# Patient Record
Sex: Male | Born: 2000 | Race: White | Marital: Single | Smoking: Never smoker
Health system: Southern US, Community
[De-identification: ages and names within clinical notes are randomized; demographics above are authoritative.]

---
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
## 2018-03-04 IMAGING — DX DG ABDOMEN 1V
1 series · 1 of 1 positions shown · non-contrast
Comparison: None.

CLINICAL DATA: Right lower quadrant abdominal pain for 1 month.

EXAM:
ABDOMEN - 1 VIEW

[dg abd 1 view]
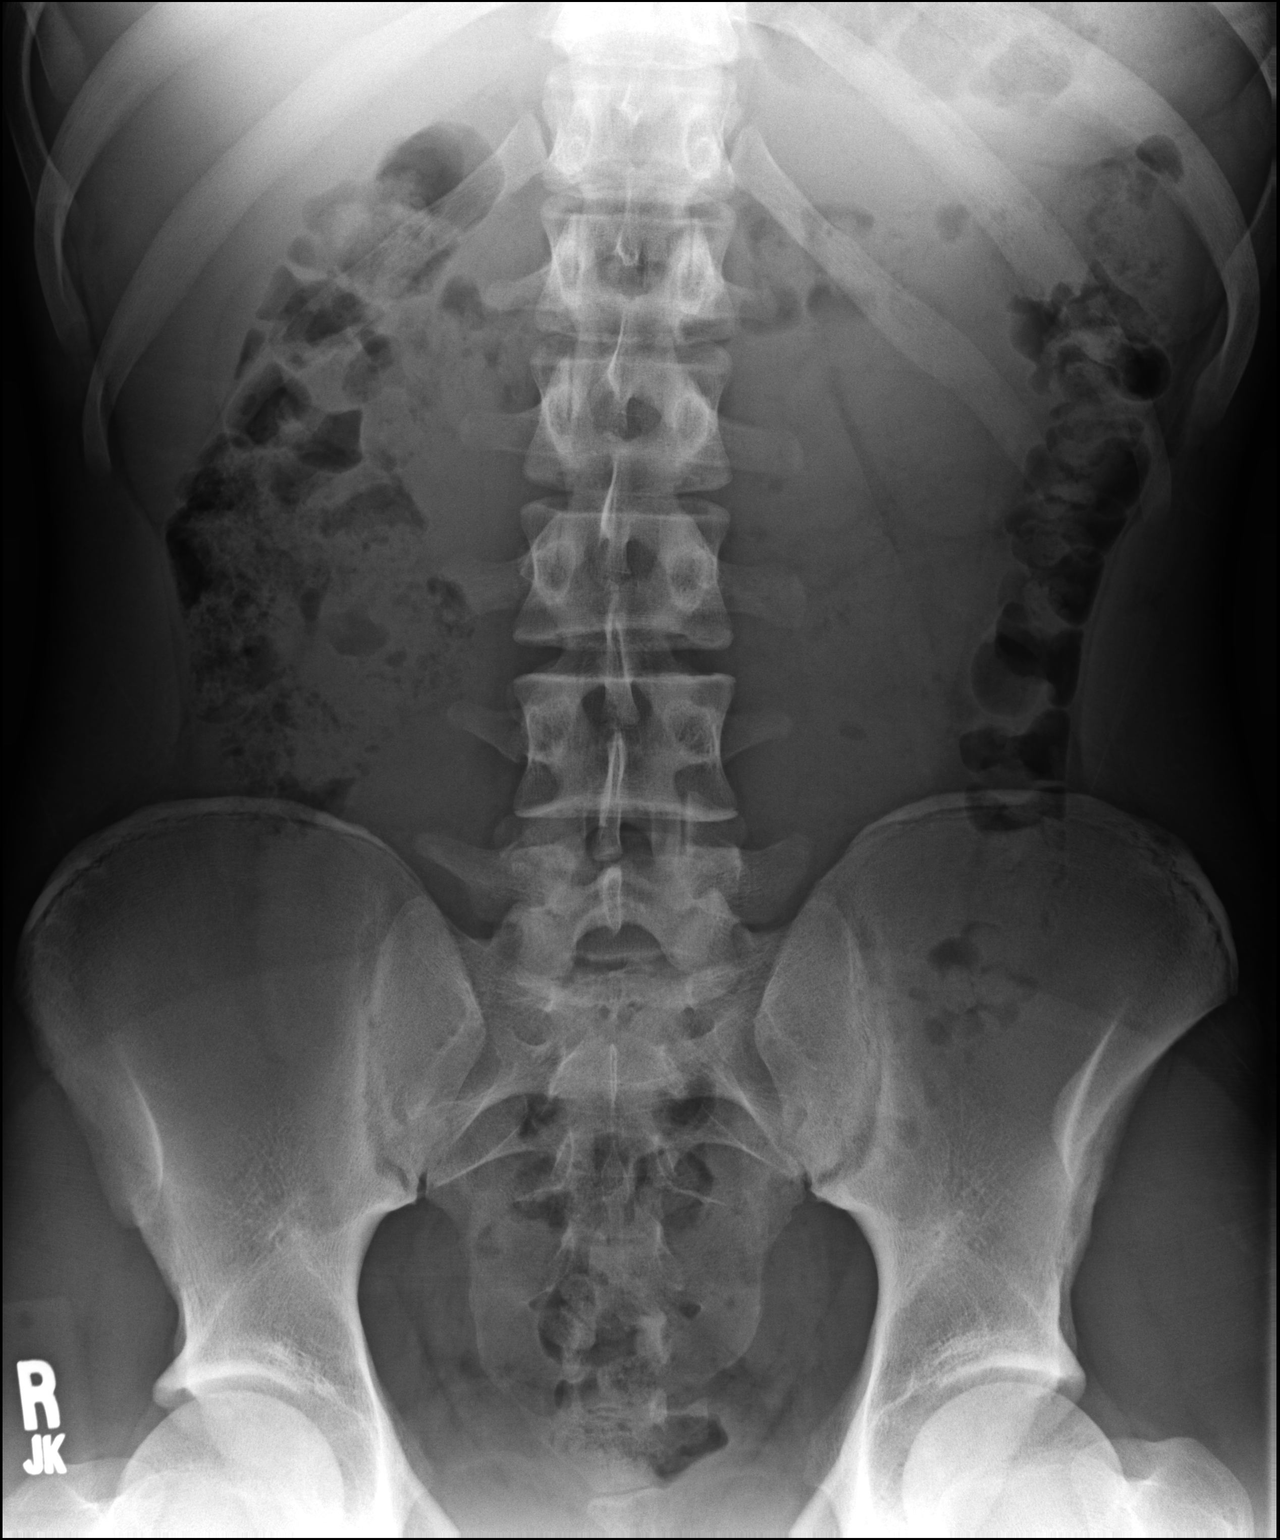

[1 of 1 positions shown; findings below may reference images not displayed]

FINDINGS: The bowel gas pattern is normal. No radio-opaque calculi or other
significant radiographic abnormality are seen.
IMPRESSION: Negative one view abdomen.

## 2018-04-27 ENCOUNTER — Encounter: Payer: Self-pay | Admitting: Family Medicine

## 2018-04-27 ENCOUNTER — Ambulatory Visit (INDEPENDENT_AMBULATORY_CARE_PROVIDER_SITE_OTHER): Payer: 59

## 2018-04-27 ENCOUNTER — Other Ambulatory Visit: Payer: Self-pay

## 2018-04-27 ENCOUNTER — Ambulatory Visit (INDEPENDENT_AMBULATORY_CARE_PROVIDER_SITE_OTHER): Payer: 59 | Admitting: Family Medicine

## 2018-04-27 ENCOUNTER — Other Ambulatory Visit: Payer: Self-pay | Admitting: Family Medicine

## 2018-04-27 VITALS — BP 122/70 | HR 98 | Temp 98.4°F | Ht 79.0 in | Wt 240.0 lb

## 2018-04-27 DIAGNOSIS — D72820 Lymphocytosis (symptomatic): Secondary | ICD-10-CM | POA: Diagnosis not present

## 2018-04-27 DIAGNOSIS — R042 Hemoptysis: Secondary | ICD-10-CM

## 2018-04-27 DIAGNOSIS — J351 Hypertrophy of tonsils: Secondary | ICD-10-CM | POA: Diagnosis not present

## 2018-04-27 DIAGNOSIS — Z114 Encounter for screening for human immunodeficiency virus [HIV]: Secondary | ICD-10-CM | POA: Diagnosis not present

## 2018-04-27 LAB — COMPREHENSIVE METABOLIC PANEL
ALT: 139 U/L — ABNORMAL HIGH (ref 0–53)
AST: 114 U/L — ABNORMAL HIGH (ref 0–37)
Albumin: 4.4 g/dL (ref 3.5–5.2)
Alkaline Phosphatase: 127 U/L (ref 52–171)
BUN: 9 mg/dL (ref 6–23)
CO2: 26 mEq/L (ref 19–32)
Calcium: 9.6 mg/dL (ref 8.4–10.5)
Chloride: 102 mEq/L (ref 96–112)
Creatinine, Ser: 1.23 mg/dL (ref 0.40–1.50)
GFR: 75.03 mL/min (ref >60.00)
Glucose, Bld: 89 mg/dL (ref 70–99)
Potassium: 4.2 mEq/L (ref 3.5–5.1)
Sodium: 137 mEq/L (ref 135–145)
Total Bilirubin: 1.4 mg/dL — ABNORMAL HIGH (ref 0.2–1.2)
Total Protein: 7.4 g/dL (ref 6.0–8.3)

## 2018-04-27 LAB — CBC WITH DIFFERENTIAL/PLATELET
Basophils Absolute: 0.1 10*3/uL (ref 0.0–0.1)
Basophils Relative: 0.6 % (ref 0.0–3.0)
Eosinophils Absolute: 0.1 10*3/uL (ref 0.0–0.7)
Eosinophils Relative: 0.8 % (ref 0.0–5.0)
HCT: 42.8 % (ref 36.0–49.0)
Hemoglobin: 14.7 g/dL (ref 12.0–16.0)
Lymphocytes Relative: 68.5 % — ABNORMAL HIGH (ref 24.0–48.0)
Lymphs Abs: 6.4 10*3/uL — ABNORMAL HIGH (ref 0.7–4.0)
MCHC: 34.3 g/dL (ref 31.0–37.0)
MCV: 82.8 fl (ref 78.0–98.0)
Monocytes Absolute: 0.9 10*3/uL (ref 0.1–1.0)
Monocytes Relative: 10 % (ref 3.0–12.0)
Neutro Abs: 1.9 10*3/uL (ref 1.4–7.7)
Neutrophils Relative %: 20.1 % — ABNORMAL LOW (ref 43.0–71.0)
Platelets: 155 10*3/uL (ref 150.0–575.0)
RBC: 5.17 Mil/uL (ref 3.80–5.70)
RDW: 12.9 % (ref 11.4–15.5)
WBC: 9.3 10*3/uL (ref 4.5–13.5)

## 2018-04-27 LAB — POCT RAPID STREP A (OFFICE): Rapid Strep A Screen: NEGATIVE

## 2018-04-27 LAB — PROTIME-INR
INR: 1.2 ratio — ABNORMAL HIGH (ref 0.8–1.0)
Prothrombin Time: 14.2 s — ABNORMAL HIGH (ref 9.6–13.1)

## 2018-04-27 LAB — TSH: TSH: 1.93 u[IU]/mL (ref 0.40–5.00)

## 2018-04-27 NOTE — Patient Instructions (Signed)
I think it's coming from tonsil/posterior pharynx.  Will call do referrral to ENT and call them.  Keep up cool mist humidifier and nasal saline spray.

## 2018-04-27 NOTE — Progress Notes (Signed)
Patient: Robert Fleming MRN: [id number] DOB: Feb 22, 2000 PCP: Orland Mustard, MD     Subjective:  Chief Complaint  Patient presents with  . Hemoptysis    spitting up blood     HPI: The patient is a 18 y.o. male who presents today for spitting up blood x 2 days. He states yesterday it happened in the AM and it happened while he was laying down in his bed. It only happens when he is laying down in his bed. It also only happens in his room, not downstairs when he sleeps on the cough. It is frank, blood. He states it does go away. He has no cough, no shortness of breath and no respiratory symptoms. He denies any alcohol or vomiting prior to this. He feels 100%, and feels like blood is going down his throat into his stomach. He does not take NSAIDs, no greasy foods, no smoking. Hx of vaping in high school and a little in college. None over past 3-6 months. No other drug use.   No nausea/vomiting or dark stool. He has not had a bloody nose, denies easy bruising, stomach pain. His gums do not bleed. No hx of bleeding disorders in the family.   Review of Systems  Constitutional: Negative for chills, fatigue and fever.  HENT: Negative for congestion, dental problem, ear pain, hearing loss, nosebleeds, postnasal drip and trouble swallowing.        Spitting up blood   Eyes: Negative for visual disturbance.  Respiratory: Negative for cough, chest tightness and shortness of breath.   Cardiovascular: Negative for chest pain, palpitations and leg swelling.  Gastrointestinal: Negative for abdominal pain, blood in stool, constipation, diarrhea, nausea and vomiting.  Endocrine: Negative for cold intolerance, polydipsia, polyphagia and polyuria.  Genitourinary: Negative for dysuria and hematuria.  Musculoskeletal: Negative for arthralgias and back pain.  Skin: Negative for rash.  Neurological: Positive for headaches. Negative for dizziness.  Psychiatric/Behavioral: Negative for dysphoric mood and sleep  disturbance. The patient is not nervous/anxious.     Allergies Patient is allergic to penicillin g and penicillins.  Past Medical History Patient  has no past medical history on file.  Surgical History Patient  has no past surgical history on file.  Family History Pateint's family history is not on file.  Social History Patient  reports that he has never smoked. He has never used smokeless tobacco.    Objective: Vitals:   04/27/18 0921  BP: 122/70  Pulse: 98  Temp: 98.4 F (36.9 C)  TempSrc: Oral  SpO2: 98%  Weight: 240 lb (108.9 kg)  Height: 6\' 7"  (2.007 m)    Body mass index is 27.04 kg/m.  Physical Exam Vitals signs reviewed.  Constitutional:      Appearance: Normal appearance.  HENT:     Head: Normocephalic and atraumatic.     Right Ear: Tympanic membrane, ear canal and external ear normal.     Left Ear: Tympanic membrane, ear canal and external ear normal.     Nose: Nose normal. No congestion or rhinorrhea.     Comments: No enlarged blood vessels or bleeding vessels     Mouth/Throat:     Mouth: Mucous membranes are moist.     Comments: He has 4+ tonsils bilaterally with an enlarged/tortuous blood vessel in left tonsil and posterior pharynx. No active bleeding  Eyes:     Extraocular Movements: Extraocular movements intact.     Pupils: Pupils are equal, round, and reactive to light.  Neck:  Musculoskeletal: Normal range of motion and neck supple.  Cardiovascular:     Rate and Rhythm: Normal rate and regular rhythm.     Heart sounds: Normal heart sounds.  Pulmonary:     Effort: Pulmonary effort is normal.     Breath sounds: Normal breath sounds. No stridor.  Lymphadenopathy:     Cervical: Cervical adenopathy present.  Neurological:     Mental Status: He is alert.        Rapid strep: negative  CXR: no acute process. ?airway disease.   Assessment/plan: 1. Spitting up blood He has 2 blood vessels that could be culprit on his tonsil and  posterior pharynx. Labs done and referral to ENT placed. Called ENT and the will see him. Called mom and let her know to go ahead and call Dr. Ezzard Standing and make an appointment referral in place. Continue with cool mist humidifier and nasal saline sprays. Precautions given.   - DG Chest 2 View - POCT rapid strep A - CBC with Differential/Platelet - Comprehensive metabolic panel - Protime-INR - TSH  2. Encounter for screening for HIV  - HIV Antibody (routine testing w rflx)   3. Enlarged tonsils -4+: referral to ent.   Return if symptoms worsen or fail to improve.   Orland Mustard, MD [facility]   04/27/2018

## 2018-04-28 ENCOUNTER — Other Ambulatory Visit: Payer: Self-pay

## 2018-04-28 ENCOUNTER — Other Ambulatory Visit (INDEPENDENT_AMBULATORY_CARE_PROVIDER_SITE_OTHER): Payer: 59

## 2018-04-28 DIAGNOSIS — D72820 Lymphocytosis (symptomatic): Secondary | ICD-10-CM

## 2018-04-28 LAB — WHITE CELL DIFFERENTIAL
Band Neutrophils: 15 %
Basophils Relative: 0 % (ref 0.0–3.0)
Eosinophils Relative: 0 % (ref 0.0–5.0)
Lymphocytes Relative: 75 % — ABNORMAL HIGH (ref 24.0–48.0)
Monocytes Relative: 8 % (ref 3.0–12.0)
Neutrophils Relative %: 2 % — ABNORMAL LOW (ref 43.0–71.0)

## 2018-04-28 NOTE — Addendum Note (Signed)
Addended by: Young Berry T on: 04/28/2018 08:33 AM   Modules accepted: Orders

## 2018-04-28 NOTE — Addendum Note (Signed)
Addended by: Young Berry T on: 04/28/2018 10:32 AM   Modules accepted: Orders

## 2018-04-29 ENCOUNTER — Other Ambulatory Visit: Payer: Self-pay | Admitting: Family Medicine

## 2018-04-29 ENCOUNTER — Other Ambulatory Visit (INDEPENDENT_AMBULATORY_CARE_PROVIDER_SITE_OTHER): Payer: 59

## 2018-04-29 DIAGNOSIS — D72820 Lymphocytosis (symptomatic): Secondary | ICD-10-CM

## 2018-04-29 LAB — TEST AUTHORIZATION

## 2018-04-29 LAB — EPSTEIN-BARR VIRUS VCA ANTIBODY PANEL
EBV NA IgG: <18 U/mL
EBV VCA IgG: 103 U/mL — ABNORMAL HIGH
EBV VCA IgM: >160 U/mL — ABNORMAL HIGH

## 2018-04-29 LAB — HIV ANTIBODY (ROUTINE TESTING W REFLEX): HIV 1&2 Ab, 4th Generation: NONREACTIVE

## 2018-04-29 LAB — MONONUCLEOSIS SCREEN: Mono Screen: POSITIVE — AB

## 2018-04-29 NOTE — Progress Notes (Signed)
mono

## 2018-05-04 ENCOUNTER — Other Ambulatory Visit: Payer: Self-pay

## 2018-05-04 ENCOUNTER — Encounter: Payer: Self-pay | Admitting: Family Medicine

## 2018-05-04 ENCOUNTER — Ambulatory Visit: Payer: 59 | Admitting: Family Medicine

## 2018-05-04 VITALS — BP 120/62 | HR 104 | Temp 99.4°F | Ht 79.0 in | Wt 240.0 lb

## 2018-05-04 DIAGNOSIS — J020 Streptococcal pharyngitis: Secondary | ICD-10-CM | POA: Diagnosis not present

## 2018-05-04 LAB — POCT RAPID STREP A (OFFICE): Rapid Strep A Screen: POSITIVE — AB

## 2018-05-04 MED ORDER — PREDNISONE 20 MG PO TABS: 40.0000 mg | ORAL_TABLET | Freq: Every day | ORAL | 0 refills | Status: DC

## 2018-05-04 MED ORDER — PREDNISONE 20 MG PO TABS: 40.0000 mg | ORAL_TABLET | Freq: Every day | ORAL | 0 refills | Status: AC

## 2018-05-04 MED ORDER — CLINDAMYCIN HCL 300 MG PO CAPS: 300.0000 mg | ORAL_CAPSULE | Freq: Three times a day (TID) | ORAL | 0 refills | Status: DC

## 2018-05-04 MED ORDER — CLINDAMYCIN HCL 300 MG PO CAPS: 300.0000 mg | ORAL_CAPSULE | Freq: Three times a day (TID) | ORAL | 0 refills | Status: AC

## 2018-05-04 NOTE — Progress Notes (Signed)
Patient: Robert Fleming MRN: [id number] DOB: [birth year] PCP: Robert Mustard, MD     Subjective:  Chief Complaint  Patient presents with  . Ear Pain  . Sore Throat    HPI: The patient is a 18 y.o. male who presents today for ear pain and sore throat that started last Thursday. He saw ENT on Thursday and was diagnosed with tonsillitis and started on omnicef. He also needs his tonsils removed and they are hoping to have this done in May. He feels like he is getting worse and worse. He states it feels like he has knives when he swallows. He has not taken his temperature, but wonders if he has had one. He states his throat is so dry in the AM. He has been using a cool mist humidifier. He has been using tylenol and advil. I diagnosed him with Mono last week as well.   Review of Systems  Constitutional: Positive for fatigue and fever. Negative for chills.  HENT: Positive for congestion, ear pain, sore throat and trouble swallowing. Negative for postnasal drip, rhinorrhea, sinus pressure and sinus pain.   Respiratory: Negative for cough and shortness of breath.   Cardiovascular: Negative for chest pain.  Gastrointestinal: Negative for abdominal pain and nausea.  Musculoskeletal: Negative for back pain and neck pain.  Neurological: Negative for dizziness and headaches.  Psychiatric/Behavioral: Positive for sleep disturbance.    Allergies Patient is allergic to penicillin g and penicillins.  Past Medical History Patient  has no past medical history on file.  Surgical History Patient  has no past surgical history on file.  Family History Pateint's family history is not on file.  Social History Patient  reports that he has never smoked. He has never used smokeless tobacco.    Objective: Vitals:   05/04/18 0824  BP: 120/62  Pulse: (!) 104  Temp: 99.4 F (37.4 C)  TempSrc: Oral  Weight: 240 lb (108.9 kg)  Height: 6\' 7"  (2.007 m)    Body mass index is 27.04 kg/m.  Physical  Exam Vitals signs reviewed.  Constitutional:      Appearance: He is well-developed.  HENT:     Head: Normocephalic and atraumatic.     Right Ear: Tympanic membrane and ear canal normal.     Left Ear: Tympanic membrane and ear canal normal.     Nose: No congestion or rhinorrhea.     Mouth/Throat:     Pharynx: Uvula midline. Pharyngeal swelling, oropharyngeal exudate, posterior oropharyngeal erythema and uvula swelling present.     Tonsils: Tonsillar exudate present. 4+ on the right. 4+ on the left.     Comments: Tonsil 4+ with exudate bilaterally  Eyes:     Conjunctiva/sclera: Conjunctivae normal.     Pupils: Pupils are equal, round, and reactive to light.  Neck:     Musculoskeletal: Normal range of motion and neck supple.  Cardiovascular:     Rate and Rhythm: Normal rate and regular rhythm.     Heart sounds: Normal heart sounds.  Pulmonary:     Effort: Pulmonary effort is normal.     Breath sounds: Normal breath sounds.  Abdominal:     General: Bowel sounds are normal.     Palpations: Abdomen is soft.  Lymphadenopathy:     Cervical: Cervical adenopathy present.  Neurological:     Mental Status: He is alert.    Rapid strep: positive.     Assessment/plan: 1. Strep throat Allergy to PCN. Failed 3rd generation cephalosporin. Has been on x  5 days. Will stop this and change to clindamycin. Also sending throat culture and doing steroid burst to help with pain. recommended they do 800mg  of ibuprofen and food that feels good on the throat. If not better in a few days or getting worse need to f/u with ENT to make sure he doesn't need tonsillectomy sooner than later. Abscess precautions given as well.  - Upper Respiratory Culture - POCT rapid strep A  -mono: want him to come back next week for labs to make sure liver enzymes trending down.     Return if symptoms worsen or fail to improve.   Robert MustardAllison Jaxx Huish, MD [facility]   05/04/2018

## 2018-05-04 NOTE — Patient Instructions (Signed)
Ibuprofen 800mg  TID with food if possible for pain. Wouldn't do tylenol this works better.  If not helping let me know and i'll do pain pill  -steroids x 5 days -changing antibiotic to clindamycin. Will take this three times a day for 10 days.   Strep Throat  Strep throat is an infection of the throat. It is caused by germs. Strep throat spreads from person to person because of coughing, sneezing, or close contact. Follow these instructions at home: Medicines  Take over-the-counter and prescription medicines only as told by your doctor.  Take your antibiotic medicine as told by your doctor. Do not stop taking the medicine even if you feel better.  Have family members who also have a sore throat or fever go to a doctor. Eating and drinking  Do not share food, drinking cups, or personal items.  Try eating soft foods until your sore throat feels better.  Drink enough fluid to keep your pee (urine) clear or pale yellow. General instructions  Rinse your mouth (gargle) with a salt-water mixture 3-4 times per day or as needed. To make a salt-water mixture, stir -1 tsp of salt into 1 cup of warm water.  Make sure that all people in your house wash their hands well.  Rest.  Stay home from school or work until you have been taking antibiotics for 24 hours.  Keep all follow-up visits as told by your doctor. This is important. Contact a doctor if:  Your neck keeps getting bigger.  You get a rash, cough, or earache.  You cough up thick liquid that is green, yellow-brown, or bloody.  You have pain that does not get better with medicine.  Your problems get worse instead of getting better.  You have a fever. Get help right away if:  You throw up (vomit).  You get a very bad headache.  You neck hurts or it feels stiff.  You have chest pain or you are short of breath.  You have drooling, very bad throat pain, or changes in your voice.  Your neck is swollen or the skin gets  red and tender.  Your mouth is dry or you are peeing less than normal.  You keep feeling more tired or it is hard to wake up.  Your joints are red or they hurt. This information is not intended to replace advice given to you by your health care provider. Make sure you discuss any questions you have with your health care provider. Document Released: 06/26/2007 Document Revised: 09/06/2015 Document Reviewed: 05/02/2014 Elsevier Interactive Patient Education  [employer].

## 2018-05-04 NOTE — Addendum Note (Signed)
Addended by: Orland Mustard on: 05/04/2018 09:28 AM   Modules accepted: Orders

## 2018-05-07 LAB — CULTURE, UPPER RESPIRATORY
MICRO NUMBER:: 391629
SPECIMEN QUALITY:: ADEQUATE

## 2018-05-14 ENCOUNTER — Other Ambulatory Visit: Payer: Self-pay | Admitting: Family Medicine

## 2018-05-14 DIAGNOSIS — R748 Abnormal levels of other serum enzymes: Secondary | ICD-10-CM

## 2018-05-14 DIAGNOSIS — D72820 Lymphocytosis (symptomatic): Secondary | ICD-10-CM

## 2018-05-27 ENCOUNTER — Other Ambulatory Visit: Payer: 59

## 2018-05-27 ENCOUNTER — Other Ambulatory Visit: Payer: Self-pay

## 2018-05-27 ENCOUNTER — Other Ambulatory Visit: Payer: Self-pay | Admitting: Family Medicine

## 2018-05-27 DIAGNOSIS — D72820 Lymphocytosis (symptomatic): Secondary | ICD-10-CM

## 2018-06-04 ENCOUNTER — Other Ambulatory Visit: Payer: Self-pay

## 2018-06-04 ENCOUNTER — Other Ambulatory Visit (INDEPENDENT_AMBULATORY_CARE_PROVIDER_SITE_OTHER): Payer: 59

## 2018-06-04 DIAGNOSIS — D72820 Lymphocytosis (symptomatic): Secondary | ICD-10-CM

## 2018-06-05 LAB — COMPREHENSIVE METABOLIC PANEL WITH GFR
ALT: 27 U/L (ref 0–53)
AST: 29 U/L (ref 0–37)
Albumin: 4.8 g/dL (ref 3.5–5.2)
Alkaline Phosphatase: 84 U/L (ref 39–117)
BUN: 15 mg/dL (ref 6–23)
CO2: 27 meq/L (ref 19–32)
Calcium: 9.4 mg/dL (ref 8.4–10.5)
Chloride: 99 meq/L (ref 96–112)
Creatinine, Ser: 1.19 mg/dL (ref 0.40–1.50)
GFR: 77.87 mL/min (ref >60.00)
Glucose, Bld: 93 mg/dL (ref 70–99)
Potassium: 4.4 meq/L (ref 3.5–5.1)
Sodium: 135 meq/L (ref 135–145)
Total Bilirubin: 1.5 mg/dL — ABNORMAL HIGH (ref 0.2–1.2)
Total Protein: 7.4 g/dL (ref 6.0–8.3)

## 2018-06-05 LAB — CBC WITH DIFFERENTIAL/PLATELET
Basophils Absolute: 0.1 K/uL (ref 0.0–0.1)
Basophils Relative: 1.1 % (ref 0.0–3.0)
Eosinophils Absolute: 0.2 K/uL (ref 0.0–0.7)
Eosinophils Relative: 2.5 % (ref 0.0–5.0)
HCT: 43.2 % (ref 39.0–52.0)
Hemoglobin: 14.9 g/dL (ref 13.0–17.0)
Lymphocytes Relative: 36.6 % (ref 12.0–46.0)
Lymphs Abs: 2.2 K/uL (ref 0.7–4.0)
MCHC: 34.5 g/dL (ref 30.0–36.0)
MCV: 82.4 fl (ref 78.0–100.0)
Monocytes Absolute: 0.7 K/uL (ref 0.1–1.0)
Monocytes Relative: 10.9 % (ref 3.0–12.0)
Neutro Abs: 2.9 K/uL (ref 1.4–7.7)
Neutrophils Relative %: 48.9 % (ref 43.0–77.0)
Platelets: 209 K/uL (ref 150.0–400.0)
RBC: 5.24 Mil/uL (ref 4.22–5.81)
RDW: 12.9 % (ref 11.5–14.6)
WBC: 6 K/uL (ref 4.5–10.5)

## 2018-07-07 ENCOUNTER — Telehealth: Payer: Self-pay | Admitting: Physical Therapy

## 2018-07-07 NOTE — Telephone Encounter (Signed)
Copied from [place] [id number]. Topic: Appointment Scheduling - Scheduling Inquiry for Clinic >> Jul 07, 2018 12:43 PM Rayann Heman wrote: Reason for CRM: needs to schedule HPV vaccine

## 2018-07-09 ENCOUNTER — Ambulatory Visit: Payer: 59

## 2018-07-28 ENCOUNTER — Ambulatory Visit: Payer: 59

## 2019-04-14 ENCOUNTER — Telehealth: Payer: Self-pay

## 2019-04-14 NOTE — Telephone Encounter (Signed)
Patient refused flu vaccine.

## 2019-04-29 ENCOUNTER — Telehealth (INDEPENDENT_AMBULATORY_CARE_PROVIDER_SITE_OTHER): Payer: 59 | Admitting: Family Medicine

## 2019-04-29 ENCOUNTER — Encounter: Payer: Self-pay | Admitting: Family Medicine

## 2019-04-29 VITALS — Ht 79.0 in | Wt 240.0 lb

## 2019-04-29 DIAGNOSIS — F419 Anxiety disorder, unspecified: Secondary | ICD-10-CM | POA: Diagnosis not present

## 2019-04-29 DIAGNOSIS — F329 Major depressive disorder, single episode, unspecified: Secondary | ICD-10-CM | POA: Diagnosis not present

## 2019-04-29 MED ORDER — SERTRALINE HCL 50 MG PO TABS: 50.0000 mg | ORAL_TABLET | Freq: Every day | ORAL | 1 refills | Status: AC

## 2019-04-29 NOTE — Progress Notes (Signed)
Virtual Visit via Video   Due to the COVID-19 pandemic, this visit was completed with telemedicine (audio/video) technology to reduce patient and provider exposure as well as to preserve personal protective equipment.   I connected with Robert Fleming by a video enabled telemedicine application and verified that I am speaking with the correct person using two identifiers. Location patient: Home Location provider: [facility], Office Persons participating in the virtual visit: Kenwood Paulino, Orma Flaming, MD   I discussed the limitations of evaluation and management by telemedicine and the availability of in person appointments. The patient expressed understanding and agreed to proceed.  Care Team   Patient Care Team: Orma Flaming, MD as PCP - General (Family Medicine)  Subjective:   HPI:  Robert Fleming is being seen today for depression and anxiety. He feels like his anxiety is through the roof and doesn't really feel depressed. Denies feeling sad, unmotivated. Never been suicidal.  He states when he doesn't have basketball he is fine. He is a Therapist, art.  He knows this is what is causing him anxiety. He feels like he is in his own head. The season is over, but he is still training. He has history of anxiety and depression and was on medication when he was 19 years of age. He can't remember what he was on. He states during that time it was due to family situation. He thinks he has some things to sort through. He denies any panic attacks. +FH of depression in his dad. Currently a Ship broker at [employer]. He is starting counseling this week as well. Family is very supportive.   Review of Systems  HENT: Negative for sinus pain.   Cardiovascular: Negative for chest pain and palpitations.  Musculoskeletal: Negative for back pain.  Neurological: Negative for dizziness and headaches.  Psychiatric/Behavioral: Negative for suicidal ideas. The patient is nervous/anxious. The patient does  not have insomnia.      There are no problems to display for this patient.   Social History   Tobacco Use  . Smoking status: Never Smoker  . Smokeless tobacco: Never Used  Substance Use Topics  . Alcohol use: Not on file    Current Outpatient Medications:  .  clindamycin (CLEOCIN) 300 MG capsule, Take 1 capsule (300 mg total) by mouth 3 (three) times daily. (Patient not taking: Reported on 04/29/2019), Disp: 30 capsule, Rfl: 0 .  predniSONE (DELTASONE) 20 MG tablet, Take 2 tablets (40 mg total) by mouth daily with breakfast. (Patient not taking: Reported on 04/29/2019), Disp: 10 tablet, Rfl: 0  Allergies  Allergen Reactions  . Penicillin G Hives  . Penicillins     Objective:   VITALS: Per patient if applicable, see vitals. GENERAL: Alert, appears well and in no acute distress. HEENT: Atraumatic, conjunctiva clear, no obvious abnormalities on inspection of external nose and ears. NECK: Normal movements of the head and neck. CARDIOPULMONARY: No increased WOB. Speaking in clear sentences. I:E ratio WNL.  MS: Moves all visible extremities without noticeable abnormality. PSYCH: Pleasant and cooperative, well-groomed. Speech normal rate and rhythm. Affect is appropriate. Insight and judgement are appropriate. Attention is focused, linear, and appropriate. Denies any si/hi/ah/vh  NEURO: CN grossly intact. Oriented as arrived to appointment on time with no prompting. Moves both UE equally.  SKIN: No obvious lesions, wounds, erythema, or cyanosis noted on face or hands.  Depression screen [facility] 2/9 04/29/2019  Decreased Interest 3  Down, Depressed, Hopeless 3  PHQ - 2 Score 6  Altered  sleeping 2  Tired, decreased energy 2  Change in appetite 0  Feeling bad or failure about yourself  2  Trouble concentrating 0  Moving slowly or fidgety/restless 0  Suicidal thoughts 0  PHQ-9 Score 12  Difficult doing work/chores Somewhat difficult   GAD 7 : Generalized Anxiety Score 04/29/2019  Nervous,  Anxious, on Edge 3  Control/stop worrying 3  Worry too much - different things 0  Trouble relaxing 1  Restless 0  Easily annoyed or irritable 2  Afraid - awful might happen 0  Total GAD 7 Score 9  Anxiety Difficulty Somewhat difficult     Assessment and Plan:   Robert Fleming was seen today for depression and anxiety.  Diagnoses and all orders for this visit:  Anxiety and depression  -his GAD7 and his phq9 score are mild in nature. He seems to struggle more with anxiety and college basketball is his trigger. He knows his anxiety is through the roof. He is starting counseling this week. He already exercises. Its impacting his daily life so we are going to start medication as well. Starting him on zoloft 50mg  daily. I've explained to him that drugs of the SSRI class can have side effects such as weight gain, sexual dysfunction, insomnia, headache, nausea. These medications are generally effective at alleviating symptoms of anxiety and/or depression. Let me know if significant side effects do occur. Any si/hi he is to call 911 or go to ER. Also discussed his anxiety may get worse initially, but to try and get through first 2 weeks. He is to let me know if any issues. We will do close f/u in one month and will likely still be virtual as he is at college. When back home can do some labs as well when he can follow up here.   COVID-19 Education: The signs and symptoms of COVID-19 were discussed with the patient and how to seek care for testing if needed. The importance of social distancing was discussed today. . Reviewed expectations re: course of current medical issues. . Discussed self-management of symptoms. . Outlined signs and symptoms indicating need for more acute intervention. . Patient verbalized understanding and all questions were answered. Marland Kitchen Health Maintenance issues including appropriate healthy diet, exercise, and smoking avoidance were discussed with patient. . See orders for this visit as  documented in the electronic medical record.  Marland Kitchen, MD  Records requested if needed. Time spent: 25 minutes, of which >50% was spent in obtaining information about his symptoms, reviewing his previous labs, evaluations, and treatments, counseling him about his condition (please see the discussed topics above), and developing a plan to further investigate it; he had a number of questions which I addressed.

## 2020-04-02 IMAGING — DX CHEST - 2 VIEW
2 series · 2 of 2 positions shown · non-contrast
Comparison: None.

CLINICAL DATA: Spitting up blood. No known injury. No cough or sore
throat.

EXAM:
CHEST - 2 VIEW

[chest pa]
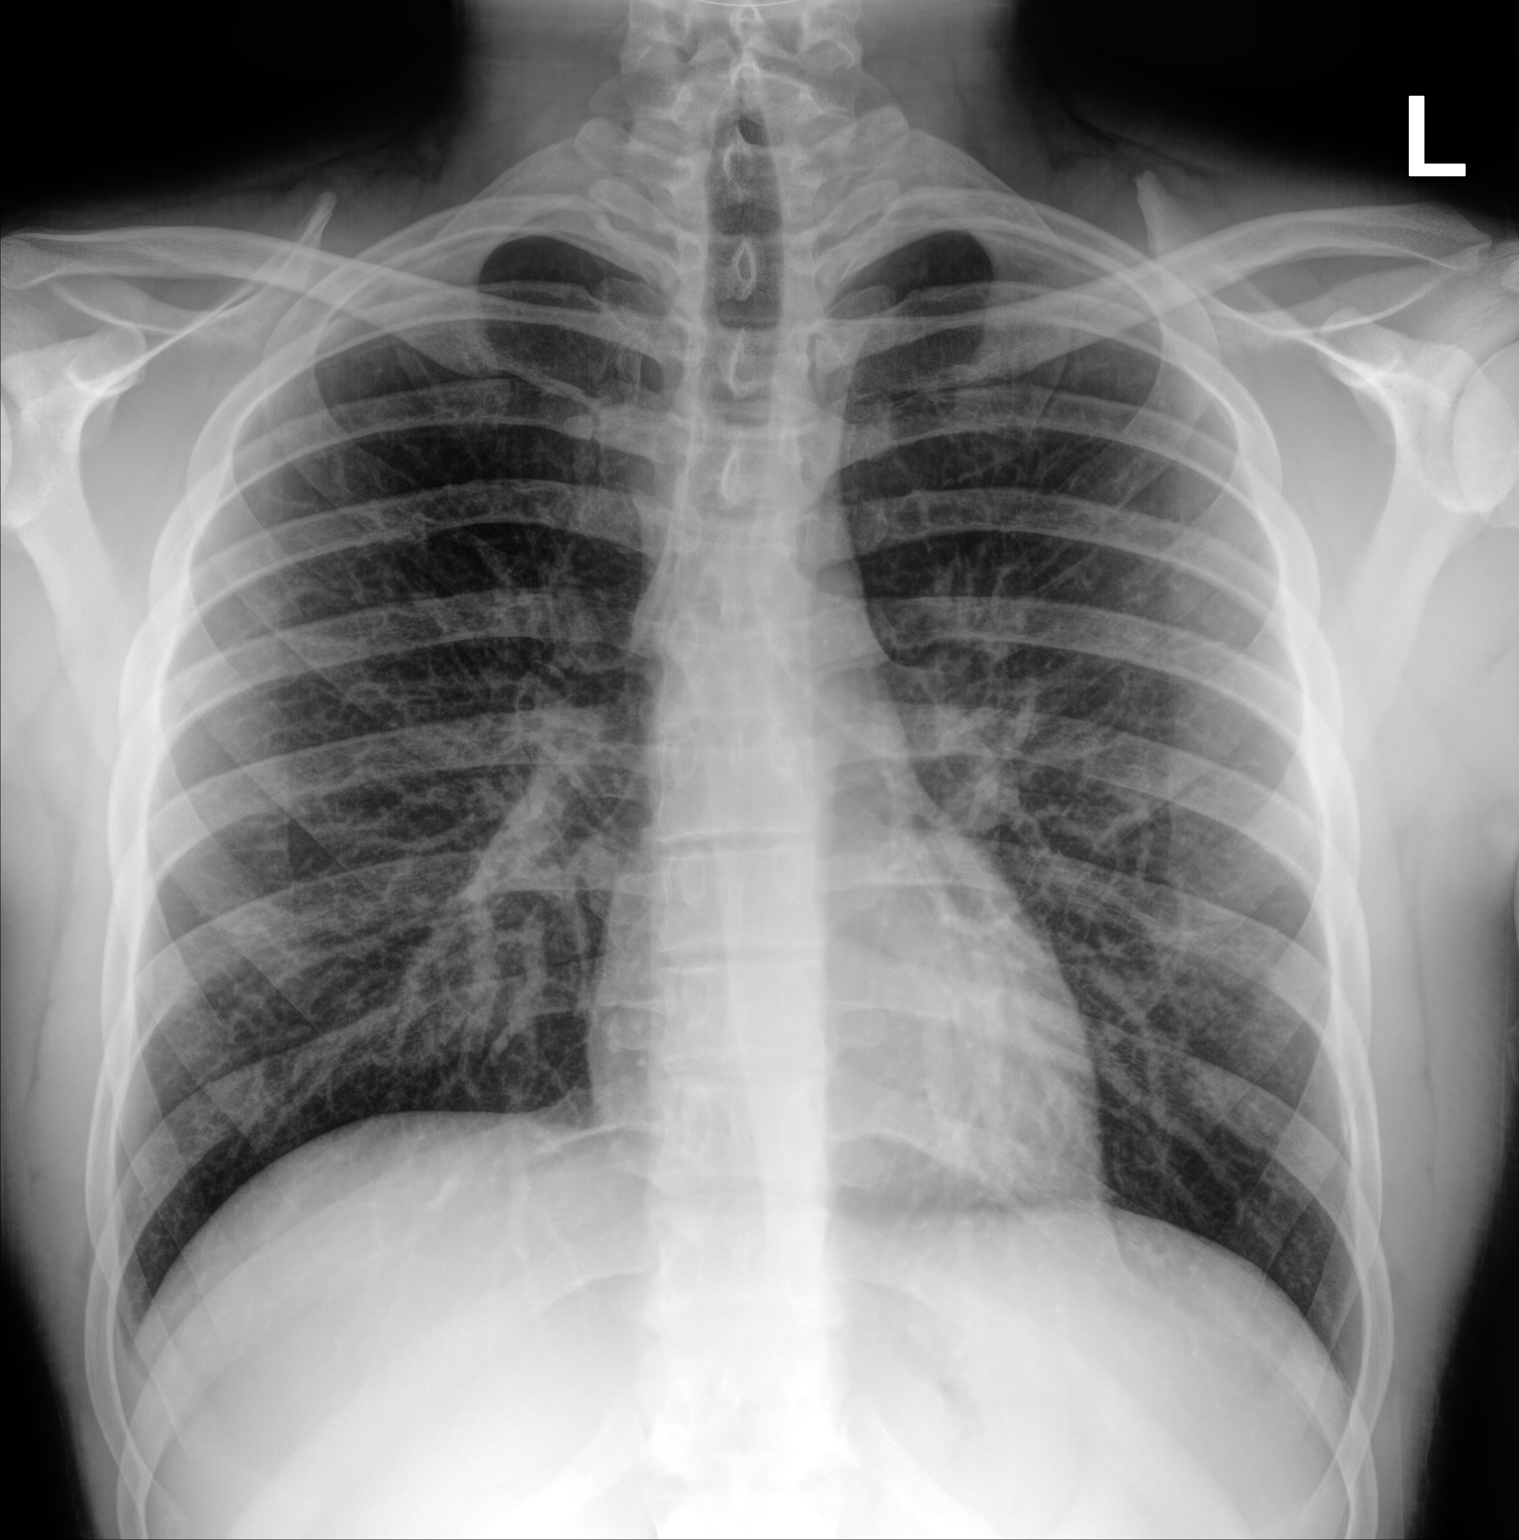

[chest lat]
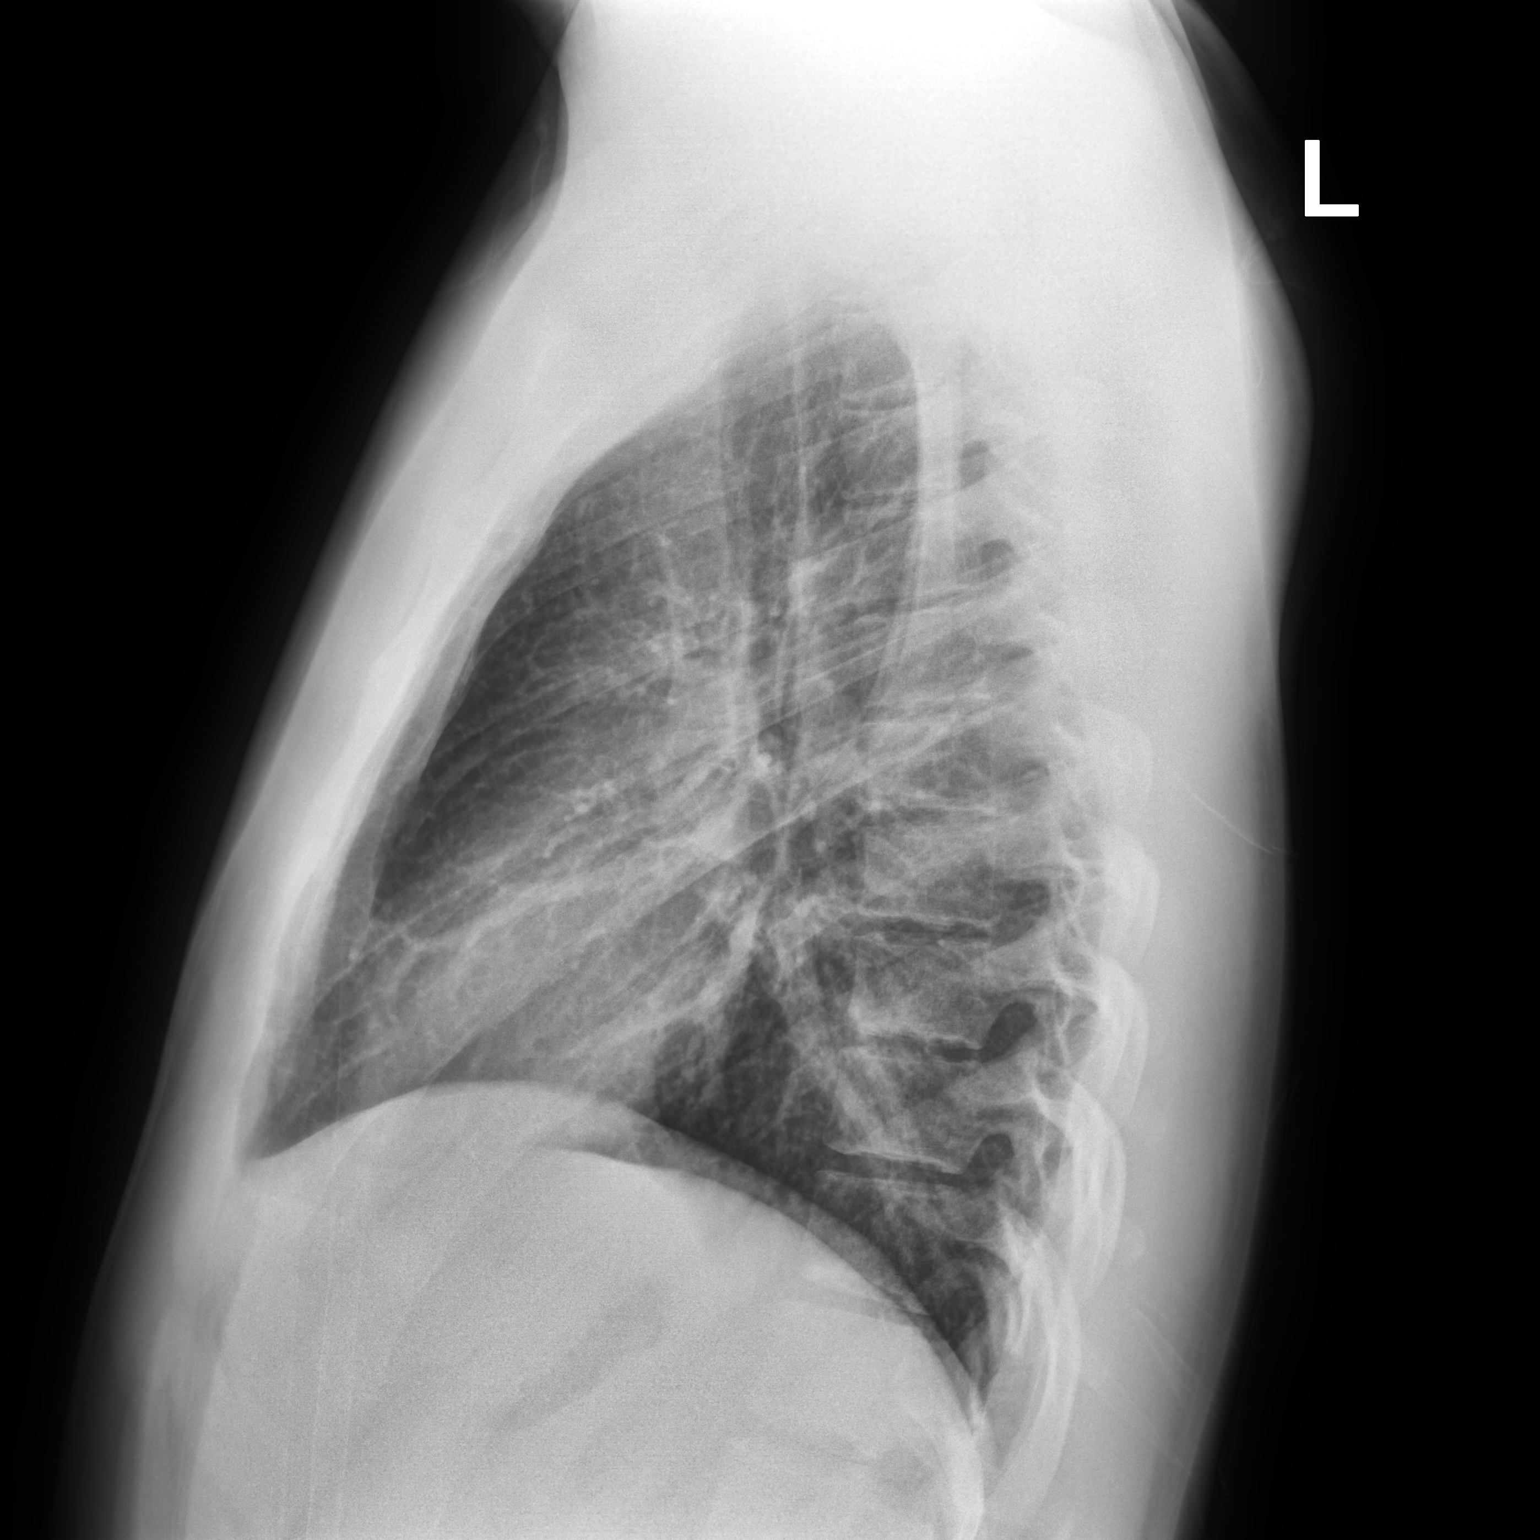

[2 of 2 positions shown; findings below may reference images not displayed]

FINDINGS: Normal cardiac silhouette and mediastinal contours. Minimal
perihilar interstitial thickening. No discrete focal airspace
opacities. No pleural effusion or pneumothorax. No evidence of
edema. No acute osseus abnormalities.
IMPRESSION: Findings suggestive of airways disease. No focal airspace opacities
to suggest pneumonia.

## 2023-07-16 DIAGNOSIS — Z0000 Encounter for general adult medical examination without abnormal findings: Secondary | ICD-10-CM | POA: Diagnosis not present
